# Patient Record
Sex: Female | Born: 2005 | Race: White | Hispanic: No | Marital: Single | Smoking: Never smoker
Health system: Southern US, Community
[De-identification: ages and names within clinical notes are randomized; demographics above are authoritative.]

## PROBLEM LIST (undated history)

## (undated) DIAGNOSIS — E669 Obesity, unspecified: Secondary | ICD-10-CM

## (undated) DIAGNOSIS — J45909 Unspecified asthma, uncomplicated: Secondary | ICD-10-CM

## (undated) DIAGNOSIS — T7840XA Allergy, unspecified, initial encounter: Secondary | ICD-10-CM

## (undated) HISTORY — DX: Obesity, unspecified: E66.9

## (undated) HISTORY — PX: TONSILLECTOMY: SUR1361

## (undated) HISTORY — DX: Unspecified asthma, uncomplicated: J45.909

## (undated) HISTORY — PX: ADENOIDECTOMY: SUR15

## (undated) HISTORY — DX: Allergy, unspecified, initial encounter: T78.40XA

---
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
## 2023-07-28 ENCOUNTER — Emergency Department (HOSPITAL_BASED_OUTPATIENT_CLINIC_OR_DEPARTMENT_OTHER)

## 2023-07-28 ENCOUNTER — Other Ambulatory Visit: Payer: Self-pay

## 2023-07-28 ENCOUNTER — Encounter (HOSPITAL_BASED_OUTPATIENT_CLINIC_OR_DEPARTMENT_OTHER): Payer: Self-pay | Admitting: Emergency Medicine

## 2023-07-28 ENCOUNTER — Emergency Department (HOSPITAL_BASED_OUTPATIENT_CLINIC_OR_DEPARTMENT_OTHER)
Admission: EM | Admit: 2023-07-28 | Discharge: 2023-07-29 | Disposition: A | Discharge status: Home / Self Care | Attending: Emergency Medicine | Admitting: Emergency Medicine

## 2023-07-28 DIAGNOSIS — W1642XA Fall into unspecified water causing other injury, initial encounter: Secondary | ICD-10-CM | POA: Insufficient documentation

## 2023-07-28 DIAGNOSIS — S0990XA Unspecified injury of head, initial encounter: Secondary | ICD-10-CM | POA: Diagnosis present

## 2023-07-28 DIAGNOSIS — S0093XA Contusion of unspecified part of head, initial encounter: Secondary | ICD-10-CM

## 2023-07-28 DIAGNOSIS — S1093XA Contusion of unspecified part of neck, initial encounter: Secondary | ICD-10-CM | POA: Insufficient documentation
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
## 2023-07-29 MED ORDER — IBUPROFEN 800 MG PO TABS: 800.0000 mg | ORAL_TABLET | Freq: Four times a day (QID) | ORAL | 0 refills | Status: AC | PRN

## 2023-07-29 MED ORDER — ACETAMINOPHEN 500 MG PO TABS
1000.0000 mg | ORAL_TABLET | Freq: Once | ORAL | Status: AC
  Administered 2023-07-29: 1000 mg via ORAL
  Filled 2023-07-29: qty 2

## 2023-07-29 MED ORDER — IBUPROFEN 800 MG PO TABS
800.0000 mg | ORAL_TABLET | Freq: Once | ORAL | Status: AC
Start: 2023-07-29 — End: 2023-07-29
  Administered 2023-07-29: 800 mg via ORAL
  Filled 2023-07-29: qty 1
